# Patient Record
Sex: Male | Born: 1990 | Race: White | Hispanic: No | Marital: Single | Smoking: Current some day smoker
Health system: Southern US, Community
[De-identification: ages and names within clinical notes are randomized; demographics above are authoritative.]

## PROBLEM LIST (undated history)

## (undated) DIAGNOSIS — F319 Bipolar disorder, unspecified: Secondary | ICD-10-CM

## (undated) HISTORY — PX: WRIST SURGERY: SHX841

## (undated) HISTORY — PX: OTHER SURGICAL HISTORY: SHX169

---
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
## 2011-04-26 ENCOUNTER — Ambulatory Visit (INDEPENDENT_AMBULATORY_CARE_PROVIDER_SITE_OTHER): Payer: BC Managed Care – PPO | Admitting: Internal Medicine

## 2011-04-26 VITALS — BP 134/90 | HR 62 | Temp 98.5°F | Resp 16 | Ht 69.25 in | Wt 160.0 lb

## 2011-04-26 DIAGNOSIS — L309 Dermatitis, unspecified: Secondary | ICD-10-CM

## 2011-04-26 DIAGNOSIS — Z1329 Encounter for screening for other suspected endocrine disorder: Secondary | ICD-10-CM

## 2011-04-26 DIAGNOSIS — L259 Unspecified contact dermatitis, unspecified cause: Secondary | ICD-10-CM

## 2011-04-26 DIAGNOSIS — J309 Allergic rhinitis, unspecified: Secondary | ICD-10-CM

## 2011-04-26 MED ORDER — FLUTICASONE PROPIONATE 50 MCG/ACT NA SUSP: 2.0000 | Freq: Every day | NASAL | Status: DC

## 2011-04-26 MED ORDER — PREDNISONE 20 MG PO TABS: ORAL_TABLET | ORAL | Status: DC

## 2011-04-26 NOTE — Progress Notes (Signed)
  Subjective:    Patient ID: Anthony Frazier, male    DOB: May 05, 1990, 21 y.o.   MRN: [id number]  Rash This is a new problem. The current episode started 1 to 4 weeks ago. The problem is unchanged. The rash is diffuse. The rash is characterized by dryness and itchiness. It is unknown if there was an exposure to a precipitant. Associated symptoms include congestion and rhinorrhea. Pertinent negatives include no shortness of breath or sore throat. His past medical history is significant for allergies and eczema.  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  He has been doing "Jordan" recreationally which he describes as a unknown mixture of illicit substances and his rash started 4 weeks ago after using this med.  He has a history of allergies and eczema.  He has not tried to use any meds topically for his rash.    Review of Systems  HENT: Positive for congestion and rhinorrhea. Negative for sore throat.   Respiratory: Negative for shortness of breath.   Cardiovascular: Negative.   Gastrointestinal: Negative.   Genitourinary: Negative.   Musculoskeletal: Negative.   Skin: Positive for rash.  Neurological: Negative.   Hematological: Negative.   Psychiatric/Behavioral: Negative.   All other systems reviewed and are negative.       Objective:   Physical Exam  Vitals reviewed. Constitutional: He appears well-developed and well-nourished. No distress.  HENT:  Head: Normocephalic.  Cardiovascular: Normal rate, regular rhythm and normal heart sounds.   Pulmonary/Chest: Effort normal and breath sounds normal. No respiratory distress. He has no wheezes. He has no rales.  Skin: He is not diaphoretic.       Dry skin with red papular rash on his elbows, wrists, and behind his knees and on his lower legs.  Psychiatric:       Lucid in his thoughts but bordering on manic in his gestures  and speed of speech.            Assessment & Plan:  Eczema and allergic rhinitis.  Short course of prednisone taper given.  Use Zyrtec 10 mg daily and Flonase for his nasal symptoms.  Encouraged that illicit drugs will diminish his future and very possibly cause him health problems.

## 2011-04-26 NOTE — Patient Instructions (Signed)
Take your prednisone taper as prescribed and avoid the allergen!  It is not good for you!  I have sent you a nasal spray to help with the clogged and runny nose symptoms of allergies.  Eczema Atopic dermatitis, or eczema, is an inherited type of sensitive skin. Often people with eczema have a family history of allergies, asthma, or hay fever. It causes a red itchy rash and dry scaly skin. The itchiness may occur before the skin rash and may be very intense. It is not contagious. Eczema is generally worse during the cooler winter months and often improves with the warmth of summer. Eczema usually starts showing signs in infancy. Some children outgrow eczema, but it may last through adulthood. Flare-ups may be caused by:  Eating something or contact with something you are sensitive or allergic to.   Stress.  DIAGNOSIS  The diagnosis of eczema is usually based upon symptoms and medical history. TREATMENT  Eczema cannot be cured, but symptoms usually can be controlled with treatment or avoidance of allergens (things to which you are sensitive or allergic to).  Controlling the itching and scratching.   Use over-the-counter antihistamines as directed for itching. It is especially useful at night when the itching tends to be worse.   Use over-the-counter steroid creams as directed for itching.   Scratching makes the rash and itching worse and may cause impetigo (a skin infection) if fingernails are contaminated (dirty).   Keeping the skin well moisturized with creams every day. This will seal in moisture and help prevent dryness. Lotions containing alcohol and water can dry the skin and are not recommended.   Limiting exposure to allergens.   Recognizing situations that cause stress.   Developing a plan to manage stress.  HOME CARE INSTRUCTIONS   Take prescription and over-the-counter medicines as directed by your caregiver.   Do not use anything on the skin without checking with your  caregiver.   Keep baths or showers short (5 minutes) in warm (not hot) water. Use mild cleansers for bathing. You may add non-perfumed bath oil to the bath water. It is best to avoid soap and bubble bath.   Immediately after a bath or shower, when the skin is still damp, apply a moisturizing ointment to the entire body. This ointment should be a petroleum ointment. This will seal in moisture and help prevent dryness. The thicker the ointment the better. These should be unscented.   Keep fingernails cut short and wash hands often. If your child has eczema, it may be necessary to put soft gloves or mittens on your child at night.   Dress in clothes made of cotton or cotton blends. Dress lightly, as heat increases itching.   Avoid foods that may cause flare-ups. Common foods include cow's milk, peanut butter, eggs and wheat.   Keep a child with eczema away from anyone with fever blisters. The virus that causes fever blisters (herpes simplex) can cause a serious skin infection in children with eczema.  SEEK MEDICAL CARE IF:   Itching interferes with sleep.   The rash gets worse or is not better within one week following treatment.   The rash looks infected (pus or soft yellow scabs).   You or your child has an oral temperature above 102 F (38.9 C).   Your baby is older than 3 months with a rectal temperature of 100.5 F (38.1 C) or higher for more than 1 day.   The rash flares up after contact with someone  who has fever blisters.  SEEK IMMEDIATE MEDICAL CARE IF:   Your baby is older than 3 months with a rectal temperature of 102 F (38.9 C) or higher.   Your baby is older than 3 months or younger with a rectal temperature of 100.4 F (38 C) or higher.  Document Released: 01/21/2000 Document Revised: 01/12/2011 Document Reviewed: 11/25/2008 [facility] Patient Information 2012 [place], [place].

## 2011-07-05 ENCOUNTER — Other Ambulatory Visit: Payer: Self-pay | Admitting: Internal Medicine

## 2011-07-05 ENCOUNTER — Ambulatory Visit (INDEPENDENT_AMBULATORY_CARE_PROVIDER_SITE_OTHER): Payer: BC Managed Care – PPO | Admitting: Family Medicine

## 2011-07-05 ENCOUNTER — Encounter: Payer: Self-pay | Admitting: Family Medicine

## 2011-07-05 VITALS — BP 118/72 | HR 69 | Temp 98.3°F | Resp 16 | Ht 69.25 in | Wt 157.8 lb

## 2011-07-05 DIAGNOSIS — B86 Scabies: Secondary | ICD-10-CM

## 2011-07-05 MED ORDER — PREDNISONE 20 MG PO TABS: ORAL_TABLET | ORAL | Status: DC

## 2011-07-05 MED ORDER — IVERMECTIN 3 MG PO TABS: 3.0000 mg | ORAL_TABLET | Freq: Once | ORAL | Status: DC

## 2011-07-05 NOTE — Progress Notes (Signed)
This is a 21 year old young man who works at the [employer] and has a 5 month history of itching on his forearms, abdomen, and groin. There is quite a bit of itching between the fingers.  Objective multiple papules in streaks on forearms and between fingers  Other papules on abdomen with multiple excoriation both abdomen and forearms.  Assessment: Scabies  Plan: Prednisone and ivermectin.

## 2011-07-05 NOTE — Patient Instructions (Signed)
Scabies Scabies are small bugs (mites) that burrow under the skin and cause red bumps and severe itching. These bugs can only be seen with a microscope. Scabies are highly contagious. They can spread easily from person to person by direct contact. They are also spread through sharing clothing or linens that have the scabies mites living in them. It is not unusual for an entire family to become infected through shared towels, clothing, or bedding.  HOME CARE INSTRUCTIONS   Your caregiver may prescribe a cream or lotion to kill the mites. If this cream is prescribed; massage the cream into the entire area of the body from the neck to the bottom of both feet. Also massage the cream into the scalp and face if your child is less than 1 year old. Avoid the eyes and mouth.   Leave the cream on for 8 to12 hours. Do not wash your hands after application. Your child should bathe or shower after the 8 to 12 hour application period. Sometimes it is helpful to apply the cream to your child at right before bedtime.   One treatment is usually effective and will eliminate approximately 95% of infestations. For severe cases, your caregiver may decide to repeat the treatment in 1 week. Everyone in your household should be treated with one application of the cream.   New rashes or burrows should not appear after successful treatment within 24 to 48 hours; however the itching and rash may last for 2 to 4 weeks after successful treatment. If your symptoms persist longer than this, see your caregiver.   Your caregiver also may prescribe a medication to help with the itching or to help the rash go away more quickly.   Scabies can live on clothing or linens for up to 3 days. Your entire child's recently used clothing, towels, stuffed toys, and bed linens should be washed in hot water and then dried in a dryer for at least 20 minutes on high heat. Items that cannot be washed should be enclosed in a plastic bag for at least 3  days.   To help relieve itching, bathe your child in a cool bath or apply cool washcloths to the affected areas.   Your child may return to school after treatment with the prescribed cream.  SEEK MEDICAL CARE IF:   The itching persists longer than 4 weeks after treatment.   The rash spreads or becomes infected (the area has red blisters or yellow-tan crust).  Document Released: 01/23/2005 Document Revised: 01/12/2011 Document Reviewed: 06/03/2008 [employer] Patient Information 2012 [employer]. 

## 2011-07-18 ENCOUNTER — Ambulatory Visit (INDEPENDENT_AMBULATORY_CARE_PROVIDER_SITE_OTHER): Payer: BC Managed Care – PPO | Admitting: Family Medicine

## 2011-07-18 VITALS — BP 118/74 | HR 60 | Temp 97.8°F | Resp 20 | Ht 69.25 in | Wt 157.0 lb

## 2011-07-18 DIAGNOSIS — B86 Scabies: Secondary | ICD-10-CM

## 2011-07-18 MED ORDER — PREDNISONE 20 MG PO TABS: ORAL_TABLET | ORAL | Status: DC

## 2011-07-18 MED ORDER — IVERMECTIN 3 MG PO TABS: 3.0000 mg | ORAL_TABLET | Freq: Once | ORAL | Status: DC

## 2011-07-18 NOTE — Progress Notes (Signed)
21 yo working at [employer] with episode of scabies.  Rash is coming back  O:  Faint papular forearm and abdominal rash  A:  Recurrent scabies  P:  Refill pred and stromectol 1. Scabies  ivermectin (STROMECTOL) 3 MG TABS, predniSONE (DELTASONE) 20 MG tablet

## 2011-12-26 ENCOUNTER — Other Ambulatory Visit: Payer: Self-pay | Admitting: Family Medicine

## 2012-02-26 ENCOUNTER — Other Ambulatory Visit: Payer: Self-pay | Admitting: Family Medicine
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
## 2012-03-27 IMAGING — CR DG SHOULDER 2+V*R*
2 series · 2 of 2 positions shown · non-contrast
Comparison: Right shoulder earlier this same date.

CLINICAL DATA: Reduction of dislocation.

RIGHT SHOULDER - 2+ VIEW

[w shoulder ap internal righ]
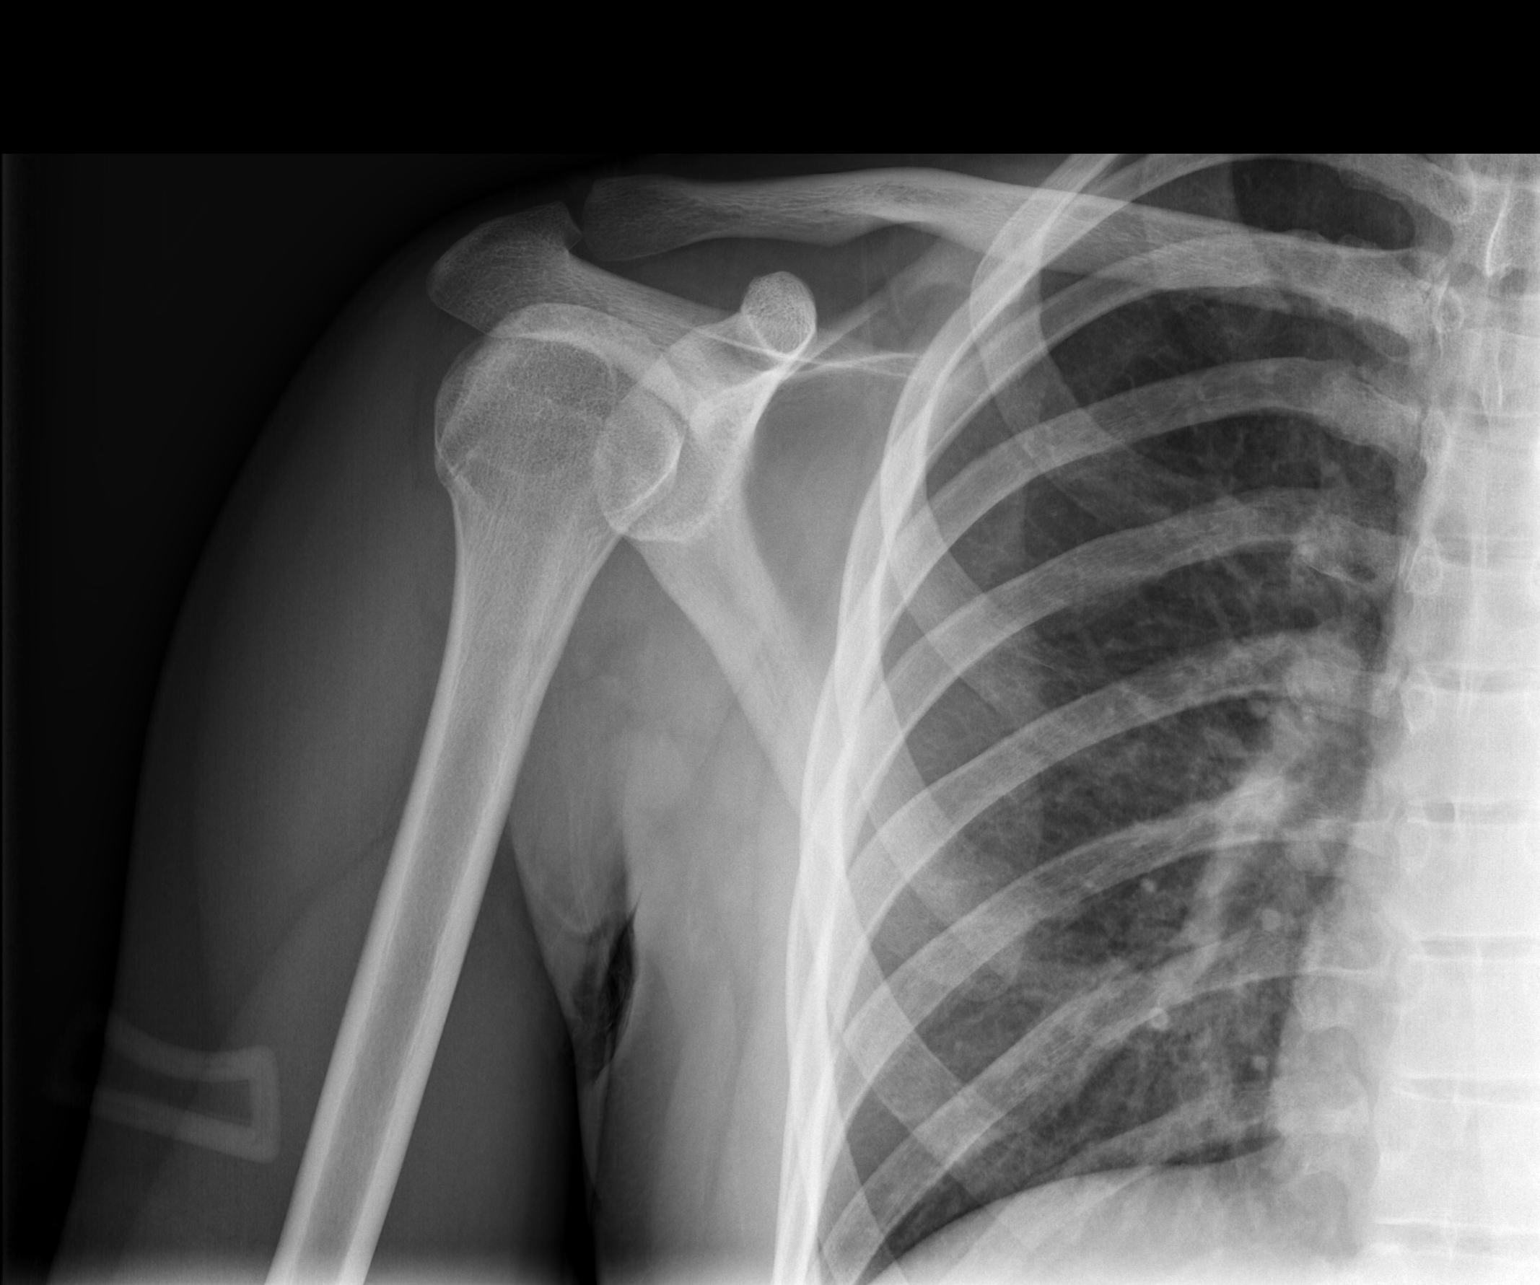

[w shoulder y view right]
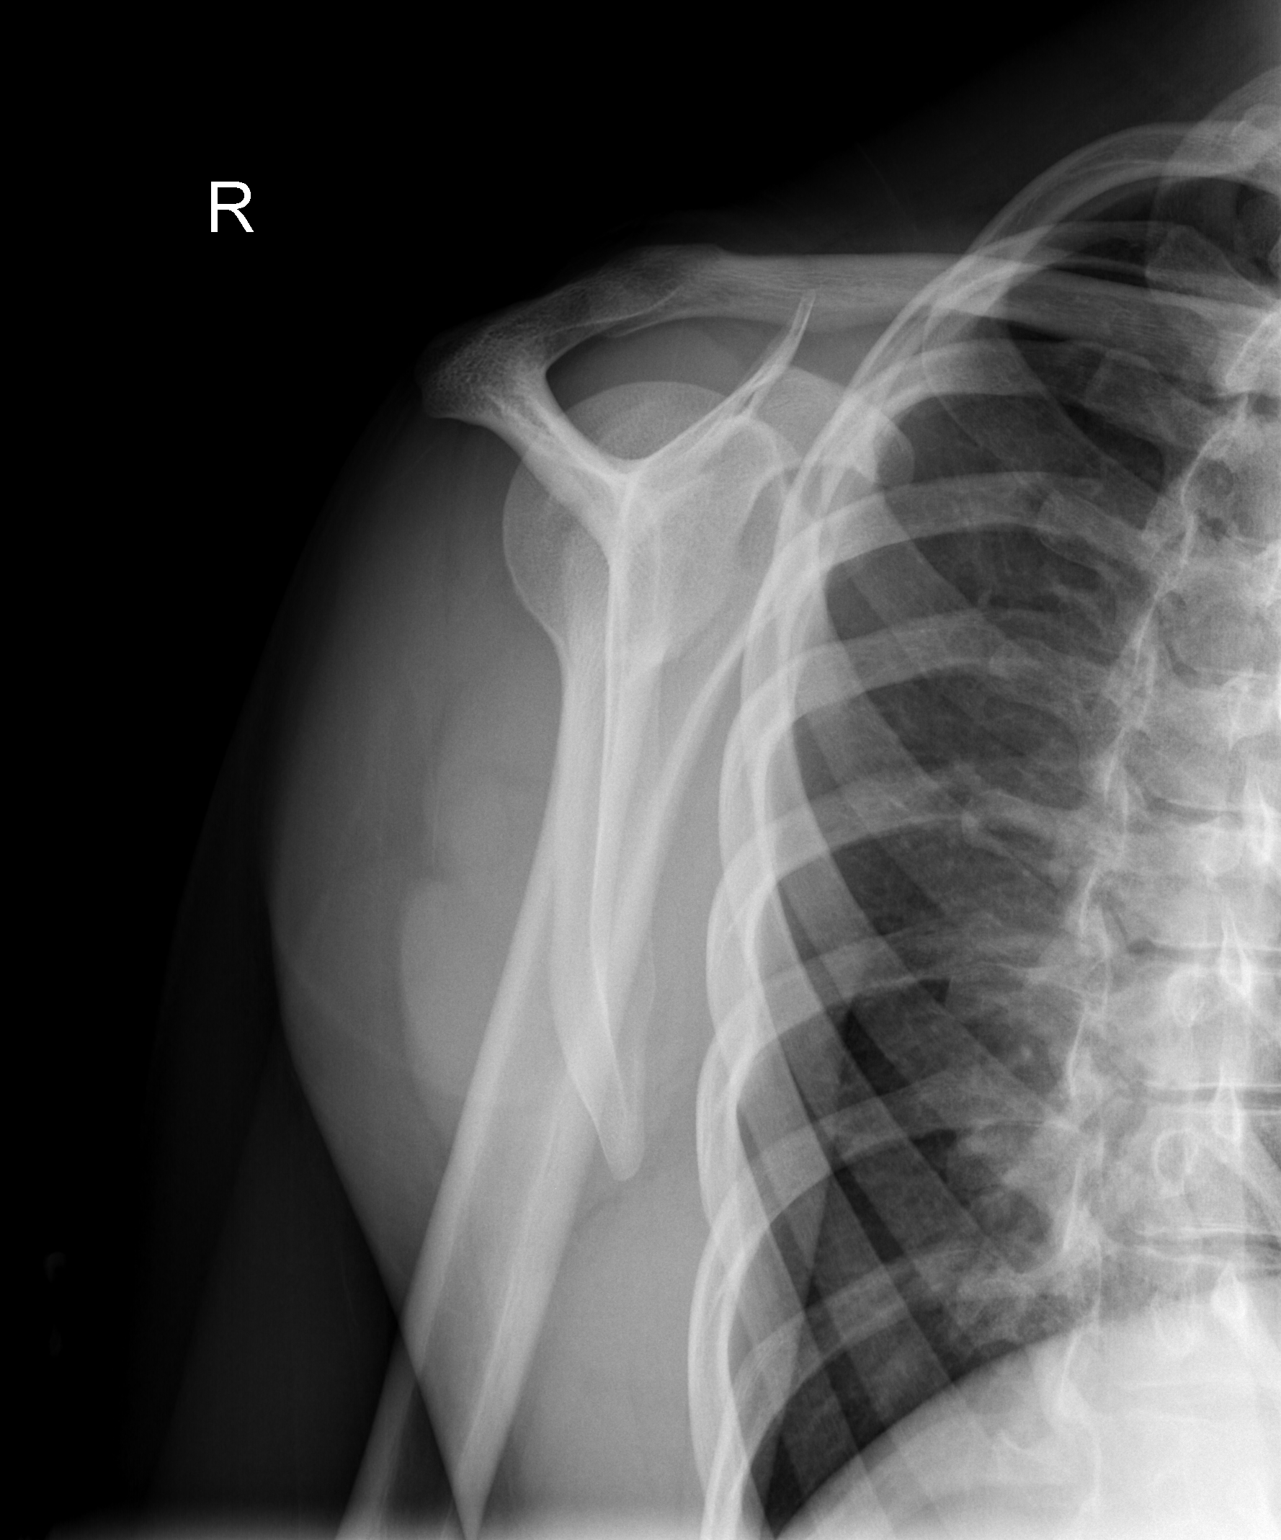

[2 of 2 positions shown; findings below may reference images not displayed]

FINDINGS: Right humerus is been reduced.  [place]-Sachs
deformity is identified.
IMPRESSION: As above.

## 2013-09-08 ENCOUNTER — Ambulatory Visit (INDEPENDENT_AMBULATORY_CARE_PROVIDER_SITE_OTHER): Payer: BC Managed Care – PPO | Admitting: Internal Medicine

## 2013-09-08 VITALS — BP 122/72 | HR 65 | Temp 97.6°F | Resp 16 | Ht 69.5 in | Wt 179.6 lb

## 2013-09-08 DIAGNOSIS — F39 Unspecified mood [affective] disorder: Secondary | ICD-10-CM

## 2013-09-08 DIAGNOSIS — Z7189 Other specified counseling: Secondary | ICD-10-CM

## 2013-09-08 LAB — CBC WITH DIFFERENTIAL/PLATELET
BASOS PCT: 1 % (ref 0–1)
Basophils Absolute: 0.1 10*3/uL (ref 0.0–0.1)
EOS ABS: 0.2 10*3/uL (ref 0.0–0.7)
EOS PCT: 3 % (ref 0–5)
HCT: 41 % (ref 39.0–52.0)
Hemoglobin: 14.1 g/dL (ref 13.0–17.0)
LYMPHS ABS: 2.5 10*3/uL (ref 0.7–4.0)
Lymphocytes Relative: 35 % (ref 12–46)
MCH: 30.3 pg (ref 26.0–34.0)
MCHC: 34.4 g/dL (ref 30.0–36.0)
MCV: 88.2 fL (ref 78.0–100.0)
Monocytes Absolute: 0.4 10*3/uL (ref 0.1–1.0)
Monocytes Relative: 6 % (ref 3–12)
NEUTROS PCT: 55 % (ref 43–77)
Neutro Abs: 3.9 10*3/uL (ref 1.7–7.7)
PLATELETS: 292 10*3/uL (ref 150–400)
RBC: 4.65 MIL/uL (ref 4.22–5.81)
RDW: 13.2 % (ref 11.5–15.5)
WBC: 7.1 10*3/uL (ref 4.0–10.5)

## 2013-09-08 LAB — COMPREHENSIVE METABOLIC PANEL
ALBUMIN: 5 g/dL (ref 3.5–5.2)
ALT: 22 U/L (ref 0–53)
AST: 29 U/L (ref 0–37)
Alkaline Phosphatase: 70 U/L (ref 39–117)
BUN: 11 mg/dL (ref 6–23)
CALCIUM: 9.7 mg/dL (ref 8.4–10.5)
CO2: 25 meq/L (ref 19–32)
Chloride: 103 mEq/L (ref 96–112)
Creat: 1.11 mg/dL (ref 0.50–1.35)
Glucose, Bld: 79 mg/dL (ref 70–99)
POTASSIUM: 3.9 meq/L (ref 3.5–5.3)
SODIUM: 141 meq/L (ref 135–145)
TOTAL PROTEIN: 7.3 g/dL (ref 6.0–8.3)
Total Bilirubin: 0.3 mg/dL (ref 0.2–1.2)

## 2013-09-08 LAB — LIPID PANEL
Cholesterol: 158 mg/dL (ref 0–200)
HDL: 56 mg/dL (ref >39)
LDL CALC: 58 mg/dL (ref 0–99)
TRIGLYCERIDES: 219 mg/dL — AB (ref <150)
Total CHOL/HDL Ratio: 2.8 Ratio
VLDL: 44 mg/dL — AB (ref 0–40)

## 2013-09-08 LAB — TSH: TSH: 5.885 u[IU]/mL — AB (ref 0.350–4.500)

## 2013-09-08 MED ORDER — LITHIUM CARBONATE 300 MG PO TABS: ORAL_TABLET | ORAL | Status: AC

## 2013-09-08 NOTE — Progress Notes (Signed)
   Subjective:    Patient ID: Anthony Frazier, male    DOB: 03/14/1990, 23 y.o.   MRN: [id number]  HPI 23 year old male here to discuss medication. He is currently taking Lithium and request refills. Recently moved back to [place] from [place] and was not taking it on a regular basis while in [place].  He feels good and no side affects with the med. General health is good. Psychiatrist visit in one month  Review of Systems     Objective:   Physical Exam  Constitutional: He is oriented to person, place, and time. He appears well-developed and well-nourished. No distress.  HENT:  Head: Normocephalic.  Mouth/Throat: Oropharynx is clear and moist.  Eyes: EOM are normal. Pupils are equal, round, and reactive to light. No scleral icterus.  Neck: Normal range of motion. Neck supple. No thyromegaly present.  Cardiovascular: Normal rate, regular rhythm and normal heart sounds.   Pulmonary/Chest: Effort normal and breath sounds normal.  Abdominal: Soft. There is no tenderness.  Lymphadenopathy:    He has no cervical adenopathy.  Neurological: He is alert and oriented to person, place, and time. No cranial nerve deficit. He exhibits normal muscle tone. Coordination normal.  Psychiatric: He has a normal mood and affect. His behavior is normal. Judgment and thought content normal.          Assessment & Plan:  Medicine review/Lithium level Health screen RF lithium 300mg  tid/see psychiatrist soon

## 2013-09-08 NOTE — Patient Instructions (Signed)
Lithium extended-release tablets What is this medicine? LITHIUM (LITH ee um) is used to prevent and treat the manic episodes caused by manic-depressive illness. This medicine may be used for other purposes; ask your health care provider or pharmacist if you have questions. COMMON BRAND NAME(S): Eskalith CR, Lithobid What should I tell my health care provider before I take this medicine? They need to know if you have any of these conditions: -Brugada Syndrome -dehydration (diarrhea or sweating) -heart or blood vessel disease -kidney disease -low level of salt in the blood, or low salt diet -an unusual or allergic reaction to lithium, other medicines, foods, dyes, or preservatives -pregnant or trying to get pregnant -breast-feeding How should I use this medicine? Take this medicine by mouth with a glass of water. Follow the directions on the prescription label. Swallow the tablets whole. Do not break, crush or chew. Take after a meal or snack to avoid stomach upset. Take your doses at regular intervals. Do not take your medicine more often than directed. The amount of this medicine you take is very important. Taking more than the prescribed dose can cause serious side effects. Do not stop taking except on your the advice of your doctor or health care professional. Talk to your pediatrician regarding the use of this medicine in children. Special care may be needed. While this drug may be prescribed for children as young as 12 years for selected conditions, precautions do apply. Overdosage: If you think you have taken too much of this medicine contact a poison control center or emergency room at once. NOTE: This medicine is only for you. Do not share this medicine with others. What if I miss a dose? If you miss a dose, take it as soon as you can. If it is almost time for your next dose, take only that dose. Do not take double or extra doses. What may interact with this medicine? Do not take this  medicine with any of the following medications: -stimulant medicines used to treat ADHD or narcolepsy This medicine may also interact with the following medications: -caffeine -calcium iodide -carbamazepine -diuretics -medicines for high blood pressure -medicines for mental problems and psychotic disturbances -metronidazole -NSAIDs, medicines for pain and inflammation, like ibuprofen or naproxen -phenytoin -potassium iodide, KI -sodium bicarbonate -sodium chloride -urea This list may not describe all possible interactions. Give your health care provider a list of all the medicines, herbs, non-prescription drugs, or dietary supplements you use. Also tell them if you smoke, drink alcohol, or use illegal drugs. Some items may interact with your medicine. What should I watch for while using this medicine? Visit your doctor or health care professional for regular checks on your progress. It can take several weeks of treatment before you start to get better. The amount of salt (sodium) in your body influences the effects of this medicine, and this medicine can increase salt loss from the body. Eat a normal diet that includes salt. Do not change to salt substitutes. Avoid changes involving diet, or medications that include large amounts of sodium like sodium bicarbonate. Ask your doctor or health care professional for advice if you are not sure. Drink plenty of fluids while you are taking this medicine. Avoid drinks that contain caffeine, such as coffee, tea and colas. You will need extra fluids if you have diarrhea or sweat a lot. This will help prevent toxic effects from this medicine. Be careful not to get overheated during exercise, saunas, hot baths, and hot weather. Consult your  doctor or health care professional if you have a high fever or persistent diarrhea. You may get drowsy or dizzy. Do not drive, use machinery, or do anything that needs mental alertness until you know how this medicine  affects you. Do not stand or sit up quickly, especially if you are an older patient. This reduces the risk of dizzy or fainting spells. What side effects may I notice from receiving this medicine? Side effects that you should report to your doctor or health care professional as soon as possible: -allergic reactions like skin rash, itching or hives, swelling of the face, lips, or tongue -blurred vision -breathing problems -clumsiness or loss of balance -confusion -difficulty speaking or swallowing -dizziness -feeling faint or lightheaded, falls -increased thirst -increased urination -loss of appetite -muscle weakness -nausea, vomiting -pain, coldness, or blue coloration of fingers or toes -sensitivity to cold -seizures -slow, fast, or irregular heartbeat (palpitations) -slurred speech -swelling in the neck -unusually weak or tired Side effects that usually do not require medical attention (report to your doctor or health care professional if they continue or are bothersome): -acne -diarrhea -mild tremor -stomach pain -weight gain This list may not describe all possible side effects. Call your doctor for medical advice about side effects. You may report side effects to FDA at 1-800-FDA-1088. Where should I keep my medicine? Keep out of the reach of children. Store at room temperature between 15 and 30 degrees C (59 and 86 degrees F). Keep container tightly closed. Protect from light. Throw away any unused medicine after the expiration date. NOTE: This sheet is a summary. It may not cover all possible information. If you have questions about this medicine, talk to your doctor, pharmacist, or health care provider.  2015, [employer]vier/Gold Standard. (2010-02-25 15:18:53) Smoking Cessation Quitting smoking is important to your health and has many advantages. However, it is not always easy to quit since nicotine is a very addictive drug. Oftentimes, people try 3 times or more before being  able to quit. This document explains the best ways for you to prepare to quit smoking. Quitting takes hard work and a lot of effort, but you can do it. ADVANTAGES OF QUITTING SMOKING  You will live longer, feel better, and live better.  Your body will feel the impact of quitting smoking almost immediately.  Within 20 minutes, blood pressure decreases. Your pulse returns to its normal level.  After 8 hours, carbon monoxide levels in the blood return to normal. Your oxygen level increases.  After 24 hours, the chance of having a heart attack starts to decrease. Your breath, hair, and body stop smelling like smoke.  After 48 hours, damaged nerve endings begin to recover. Your sense of taste and smell improve.  After 72 hours, the body is virtually free of nicotine. Your bronchial tubes relax and breathing becomes easier.  After 2 to 12 weeks, lungs can hold more air. Exercise becomes easier and circulation improves.  The risk of having a heart attack, stroke, cancer, or lung disease is greatly reduced.  After 1 year, the risk of coronary heart disease is cut in half.  After 5 years, the risk of stroke falls to the same as a nonsmoker.  After 10 years, the risk of lung cancer is cut in half and the risk of other cancers decreases significantly.  After 15 years, the risk of coronary heart disease drops, usually to the level of a nonsmoker.  If you are pregnant, quitting smoking will improve your chances of  having a healthy baby.  The people you live with, especially any children, will be healthier.  You will have extra money to spend on things other than cigarettes. QUESTIONS TO THINK ABOUT BEFORE ATTEMPTING TO QUIT You may want to talk about your answers with your health care provider.  Why do you want to quit?  If you tried to quit in the past, what helped and what did not?  What will be the most difficult situations for you after you quit? How will you plan to handle  them?  Who can help you through the tough times? Your family? Friends? A health care provider?  What pleasures do you get from smoking? What ways can you still get pleasure if you quit? Here are some questions to ask your health care provider:  How can you help me to be successful at quitting?  What medicine do you think would be best for me and how should I take it?  What should I do if I need more help?  What is smoking withdrawal like? How can I get information on withdrawal? GET READY  Set a quit date.  Change your environment by getting rid of all cigarettes, ashtrays, matches, and lighters in your home, car, or work. Do not let people smoke in your home.  Review your past attempts to quit. Think about what worked and what did not. GET SUPPORT AND ENCOURAGEMENT You have a better chance of being successful if you have help. You can get support in many ways.  Tell your family, friends, and coworkers that you are going to quit and need their support. Ask them not to smoke around you.  Get individual, group, or telephone counseling and support. Programs are available at [employer] and health centers. Call your local health department for information about programs in your area.  Spiritual beliefs and practices may help some smokers quit.  Download a "quit meter" on your computer to keep track of quit statistics, such as how long you have gone without smoking, cigarettes not smoked, and money saved.  Get a self-help book about quitting smoking and staying off tobacco. LEARN NEW SKILLS AND BEHAVIORS  Distract yourself from urges to smoke. Talk to someone, go for a walk, or occupy your time with a task.  Change your normal routine. Take a different route to work. Drink tea instead of coffee. Eat breakfast in a different place.  Reduce your stress. Take a hot bath, exercise, or read a book.  Plan something enjoyable to do every day. Reward yourself for not  smoking.  Explore interactive web-based programs that specialize in helping you quit. GET MEDICINE AND USE IT CORRECTLY Medicines can help you stop smoking and decrease the urge to smoke. Combining medicine with the above behavioral methods and support can greatly increase your chances of successfully quitting smoking.  Nicotine replacement therapy helps deliver nicotine to your body without the negative effects and risks of smoking. Nicotine replacement therapy includes nicotine gum, lozenges, inhalers, nasal sprays, and skin patches. Some may be available over-the-counter and others require a prescription.  Antidepressant medicine helps people abstain from smoking, but how this works is unknown. This medicine is available by prescription.  Nicotinic receptor partial agonist medicine simulates the effect of nicotine in your brain. This medicine is available by prescription. Ask your health care provider for advice about which medicines to use and how to use them based on your health history. Your health care provider will tell you what side  effects to look out for if you choose to be on a medicine or therapy. Carefully read the information on the package. Do not use any other product containing nicotine while using a nicotine replacement product.  RELAPSE OR DIFFICULT SITUATIONS Most relapses occur within the first 3 months after quitting. Do not be discouraged if you start smoking again. Remember, most people try several times before finally quitting. You may have symptoms of withdrawal because your body is used to nicotine. You may crave cigarettes, be irritable, feel very hungry, cough often, get headaches, or have difficulty concentrating. The withdrawal symptoms are only temporary. They are strongest when you first quit, but they will go away within 10-14 days. To reduce the chances of relapse, try to:  Avoid drinking alcohol. Drinking lowers your chances of successfully quitting.  Reduce the  amount of caffeine you consume. Once you quit smoking, the amount of caffeine in your body increases and can give you symptoms, such as a rapid heartbeat, sweating, and anxiety.  Avoid smokers because they can make you want to smoke.  Do not let weight gain distract you. Many smokers will gain weight when they quit, usually less than 10 pounds. Eat a healthy diet and stay active. You can always lose the weight gained after you quit.  Find ways to improve your mood other than smoking. FOR MORE INFORMATION  www.smokefree.gov  Document Released: 01/17/2001 Document Revised: 06/09/2013 Document Reviewed: 05/04/2011 [facility] Patient Information 2015 [place], [place]. This information is not intended to replace advice given to you by your health care provider. Make sure you discuss any questions you have with your health care provider.

## 2013-09-09 LAB — LITHIUM LEVEL: Lithium Lvl: 0.5 mEq/L — ABNORMAL LOW (ref 0.80–1.40)

## 2013-09-12 ENCOUNTER — Encounter: Payer: Self-pay | Admitting: *Deleted

## 2013-10-06 ENCOUNTER — Ambulatory Visit (INDEPENDENT_AMBULATORY_CARE_PROVIDER_SITE_OTHER): Payer: BC Managed Care – PPO | Admitting: Internal Medicine

## 2013-10-06 VITALS — BP 116/78 | HR 72 | Temp 97.5°F | Resp 16 | Ht 70.0 in | Wt 178.2 lb

## 2013-10-06 DIAGNOSIS — R946 Abnormal results of thyroid function studies: Secondary | ICD-10-CM

## 2013-10-06 DIAGNOSIS — L237 Allergic contact dermatitis due to plants, except food: Secondary | ICD-10-CM

## 2013-10-06 DIAGNOSIS — Z79899 Other long term (current) drug therapy: Secondary | ICD-10-CM

## 2013-10-06 DIAGNOSIS — L255 Unspecified contact dermatitis due to plants, except food: Secondary | ICD-10-CM

## 2013-10-06 DIAGNOSIS — R7989 Other specified abnormal findings of blood chemistry: Secondary | ICD-10-CM

## 2013-10-06 LAB — TSH: TSH: 3.229 u[IU]/mL (ref 0.350–4.500)

## 2013-10-06 LAB — T4, FREE: Free T4: 1.25 ng/dL (ref 0.80–1.80)

## 2013-10-06 LAB — T3, FREE: T3 FREE: 3.5 pg/mL (ref 2.3–4.2)

## 2013-10-06 MED ORDER — PREDNISONE 20 MG PO TABS: ORAL_TABLET | ORAL | Status: AC

## 2013-10-06 MED ORDER — METHYLPREDNISOLONE ACETATE 80 MG/ML IJ SUSP
120.0000 mg | Freq: Once | INTRAMUSCULAR | Status: AC
Start: 2013-10-06 — End: 2013-10-06
  Administered 2013-10-06: 120 mg via INTRAMUSCULAR

## 2013-10-06 NOTE — Patient Instructions (Addendum)
Poison [employer] ivy is a inflammation of the skin (contact dermatitis) caused by touching the allergens on the leaves of the ivy plant following previous exposure to the plant. The rash usually appears 48 hours after exposure. The rash is usually bumps (papules) or blisters (vesicles) in a linear pattern. Depending on your own sensitivity, the rash may simply cause redness and itching, or it may also progress to blisters which may break open. These must be well cared for to prevent secondary bacterial (germ) infection, followed by scarring. Keep any open areas dry, clean, dressed, and covered with an antibacterial ointment if needed. The eyes may also get puffy. The puffiness is worst in the morning and gets better as the day progresses. This dermatitis usually heals without scarring, within 2 to 3 weeks without treatment. HOME CARE INSTRUCTIONS  Thoroughly wash with soap and water as soon as you have been exposed to poison ivy. You have about one half hour to remove the plant resin before it will cause the rash. This washing will destroy the oil or antigen on the skin that is causing, or will cause, the rash. Be sure to wash under your fingernails as any plant resin there will continue to spread the rash. Do not rub skin vigorously when washing affected area. Poison ivy cannot spread if no oil from the plant remains on your body. A rash that has progressed to weeping sores will not spread the rash unless you have not washed thoroughly. It is also important to wash any clothes you have been wearing as these may carry active allergens. The rash will return if you wear the unwashed clothing, even several days later. Avoidance of the plant in the future is the best measure. Poison ivy plant can be recognized by the number of leaves. Generally, poison ivy has three leaves with flowering branches on a single stem. Diphenhydramine may be purchased over the counter and used as needed for itching. Do not drive with  this medication if it makes you drowsy.Ask your caregiver about medication for children. SEEK MEDICAL CARE IF:  Open sores develop.  Redness spreads beyond area of rash.  You notice purulent (pus-like) discharge.  You have increased pain.  Other signs of infection develop (such as fever). Document Released: 01/21/2000 Document Revised: 04/17/2011 Document Reviewed: 07/03/2008 [facility] Patient Information 2015 [place], [place]. This information is not intended to replace advice given to you by your health care provider. Make sure you discuss any questions you have with your health care provider. Lithium Toxicity Lithium is a medicine used to treat depression and bipolar disorders. Lithium poisoning (toxicity) occurs when the amount of lithium in the blood is too high. The greater the amount in the blood, the more problems occur. This is common among those taking lithium. This is because the amount of lithium needed to help the disease is not much different than the amount that can cause poisoning. Hence, it is very important to detect signs of toxicity early.  CAUSES  Lithium toxicity can occur for several reasons:  Intentional overdose.  Accidental overdose.  If taking many medicines, confusion can lead to taking too much or not enough of one or more prescriptions.  Even if taking lithium correctly, other medicines can change levels in the blood. Many drugs (such as medicines used for arthritis, some antibiotics, water pills (diuretics), dilantin (used for control of epilepsy), and cyclosporine (used for patients receiving transplants) can increase lithium levels.  If unrelated diseases develop, the amount of lithium  in the blood might go up or down.  While working with your caregiver to fine tune your dosing, it is possible that a prescribed dose might be too much. SYMPTOMS  The symptoms of mild-to-moderate lithium toxicity are:    Diarrhea.  Vomiting.  Drowsiness.  Thirst.  Muscle weakness.  Mild shakiness, particularly of the hands.  Frequent urination. The symptoms of severe toxicity are:  Blurred vision.  Giddiness.  Ringing in the ears (Tinnitus).  Seizures.  Increased shakiness.  Severe muscle spasms.  Loss of consciousness or coma. Some people will get symptoms of mild toxicity even if the level gets close to the top of the normal range. Even if your lab test shows a blood level that is "normal", it is important to tell your caregiver if you are having any of the symptoms noted above. DIAGNOSIS  Your caregiver will look at your signs and symptoms. You will be asked about the dose of lithium you are taking and when the last dose was taken. You will also be asked about how long you have been on the medicine. Then, your caregiver will order blood tests to check your blood lithium level.  TREATMENT  Treatment is mainly supportive.   Mild symptoms can be controlled by reducing or stopping the drug. This can be restarted 24-48 hours later at a lower dose. Your caregiver may recommend changing the daily dose of lithium.  Severe cases are treated by removing lithium from the body. Care is started in a hospital emergency department.  A tube may be placed through the nose or mouth into the stomach. The tube can be used to remove lithium that has not been digested yet. It can also be used to put medicines directly into the stomach to help stop lithium absorption.  Medicines may be used that cause diarrhea. This can help stop absorption of lithium. Your caregiver may suggest cleaning of blood using an artificial kidney (dialysis). This is usually only used for the most severe cases. HOME CARE INSTRUCTIONS   Take the dose of lithium as advised by your caregiver.  Never take more than your advised dose.  Drink plenty of water as dehydration increases the risk of lithium toxicity.  Seek the advice  of your caregiver before starting a low-salt diet as reducing your salt intake can lead to toxicity.  Check your lithium levels in the blood regularly. Tests are generally done 12-18 hours after the last dose. SEEK MEDICAL CARE IF:   You notice any symptoms of lithium toxicity as described above.  You are confused or have questions about how to take your medicines. SEEK IMMEDIATE MEDICAL CARE IF:   Your symptoms are severe.  You discover that you have accidentally taken too much lithium or too much of any other medicine. MAKE SURE YOU:   Understand these instructions.  Will watch your condition.  Will get help right away if you are not doing well or get worse. Document Released: 11/20/2006 Document Revised: 04/17/2011 Document Reviewed: 11/20/2006 [facility] Patient Information 2015 [place], [place]. This information is not intended to replace advice given to you by your health care provider. Make sure you discuss any questions you have with your health care provider.

## 2013-10-06 NOTE — Progress Notes (Signed)
   Subjective:    Patient ID: Anthony Frazier, male    DOB: Jul 07, 1990, 23 y.o.   MRN: [id number]  HPI 23 year old male here for poison ivy. He has had it for one week. It started on his right leg then moved to left knee, right forearm, left forearm, and waistline on right side. Has tried calamine lotion and other hydrocortisone cream with no relief. No fever or chills.   TSH was a little high, probable Lithium affect, will reck that. He did have a minor manic episode he worked thru this week.   Review of Systems     Objective:   Physical Exam  Constitutional: He is oriented to person, place, and time. He appears well-developed and well-nourished. No distress.  HENT:  Head: Normocephalic.  Eyes: EOM are normal. Pupils are equal, round, and reactive to light.  Neck: Normal range of motion. Neck supple.  Cardiovascular: Normal rate.   Pulmonary/Chest: Effort normal.  Neurological: He is alert and oriented to person, place, and time. He exhibits normal muscle tone. Coordination normal.  Skin: Rash noted. There is erythema.  Psychiatric: He has a normal mood and affect. His behavior is normal. Judgment and thought content normal.    Classic linear plaques, blebs, papules of PI      Assessment & Plan:  Poison Ivy Depomedrol  IM Prednisone taper 60-40-20-10  12day Calamine/prevent infection/Benedryl and zyrtec

## 2013-10-07 ENCOUNTER — Encounter: Payer: Self-pay | Admitting: Radiology

## 2015-07-18 ENCOUNTER — Emergency Department (HOSPITAL_COMMUNITY): Payer: BLUE CROSS/BLUE SHIELD

## 2015-07-18 ENCOUNTER — Encounter (HOSPITAL_COMMUNITY): Payer: Self-pay | Admitting: Emergency Medicine

## 2015-07-18 ENCOUNTER — Emergency Department (HOSPITAL_COMMUNITY)
Admission: EM | Admit: 2015-07-18 | Discharge: 2015-07-19 | Disposition: A | Discharge status: Home / Self Care | Payer: BLUE CROSS/BLUE SHIELD | Attending: Emergency Medicine | Admitting: Emergency Medicine

## 2015-07-18 DIAGNOSIS — Y939 Activity, unspecified: Secondary | ICD-10-CM | POA: Diagnosis not present

## 2015-07-18 DIAGNOSIS — M25552 Pain in left hip: Secondary | ICD-10-CM | POA: Diagnosis not present

## 2015-07-18 DIAGNOSIS — S50812A Abrasion of left forearm, initial encounter: Secondary | ICD-10-CM | POA: Insufficient documentation

## 2015-07-18 DIAGNOSIS — Y999 Unspecified external cause status: Secondary | ICD-10-CM | POA: Diagnosis not present

## 2015-07-18 DIAGNOSIS — Y92410 Unspecified street and highway as the place of occurrence of the external cause: Secondary | ICD-10-CM | POA: Insufficient documentation

## 2015-07-18 DIAGNOSIS — S80212A Abrasion, left knee, initial encounter: Secondary | ICD-10-CM | POA: Diagnosis not present

## 2015-07-18 DIAGNOSIS — F17200 Nicotine dependence, unspecified, uncomplicated: Secondary | ICD-10-CM | POA: Diagnosis not present

## 2015-07-18 DIAGNOSIS — S80211A Abrasion, right knee, initial encounter: Secondary | ICD-10-CM | POA: Diagnosis not present

## 2015-07-18 DIAGNOSIS — T07XXXA Unspecified multiple injuries, initial encounter: Secondary | ICD-10-CM

## 2015-07-18 DIAGNOSIS — S31119A Laceration without foreign body of abdominal wall, unspecified quadrant without penetration into peritoneal cavity, initial encounter: Secondary | ICD-10-CM | POA: Diagnosis not present

## 2015-07-18 DIAGNOSIS — F10920 Alcohol use, unspecified with intoxication, uncomplicated: Secondary | ICD-10-CM

## 2015-07-18 DIAGNOSIS — F10220 Alcohol dependence with intoxication, uncomplicated: Secondary | ICD-10-CM | POA: Diagnosis not present

## 2015-07-18 DIAGNOSIS — S0101XA Laceration without foreign body of scalp, initial encounter: Secondary | ICD-10-CM | POA: Insufficient documentation

## 2015-07-18 DIAGNOSIS — S0990XA Unspecified injury of head, initial encounter: Secondary | ICD-10-CM | POA: Diagnosis present

## 2015-07-18 HISTORY — DX: Bipolar disorder, unspecified: F31.9

## 2015-07-18 LAB — I-STAT CHEM 8, ED
BUN: 17 mg/dL (ref 6–20)
CREATININE: 1.4 mg/dL — AB (ref 0.61–1.24)
Calcium, Ion: 1.03 mmol/L — ABNORMAL LOW (ref 1.12–1.23)
Chloride: 102 mmol/L (ref 101–111)
Glucose, Bld: 96 mg/dL (ref 65–99)
HEMATOCRIT: 44 % (ref 39.0–52.0)
Hemoglobin: 15 g/dL (ref 13.0–17.0)
POTASSIUM: 3.7 mmol/L (ref 3.5–5.1)
Sodium: 138 mmol/L (ref 135–145)
TCO2: 21 mmol/L (ref 0–100)

## 2015-07-18 LAB — URINALYSIS, ROUTINE W REFLEX MICROSCOPIC
BILIRUBIN URINE: NEGATIVE
Glucose, UA: NEGATIVE mg/dL
KETONES UR: NEGATIVE mg/dL
Leukocytes, UA: NEGATIVE
NITRITE: NEGATIVE
PH: 5.5 (ref 5.0–8.0)
PROTEIN: NEGATIVE mg/dL
Specific Gravity, Urine: 1.007 (ref 1.005–1.030)

## 2015-07-18 LAB — COMPREHENSIVE METABOLIC PANEL
ALT: 23 U/L (ref 17–63)
AST: 32 U/L (ref 15–41)
Albumin: 4.4 g/dL (ref 3.5–5.0)
Alkaline Phosphatase: 58 U/L (ref 38–126)
Anion gap: 15 (ref 5–15)
BILIRUBIN TOTAL: 0.6 mg/dL (ref 0.3–1.2)
BUN: 14 mg/dL (ref 6–20)
CALCIUM: 8.8 mg/dL — AB (ref 8.9–10.3)
CHLORIDE: 101 mmol/L (ref 101–111)
CO2: 19 mmol/L — ABNORMAL LOW (ref 22–32)
CREATININE: 0.99 mg/dL (ref 0.61–1.24)
GFR calc Af Amer: >60 mL/min (ref >60)
Glucose, Bld: 97 mg/dL (ref 65–99)
Potassium: 3.5 mmol/L (ref 3.5–5.1)
Sodium: 135 mmol/L (ref 135–145)
TOTAL PROTEIN: 7.1 g/dL (ref 6.5–8.1)

## 2015-07-18 LAB — URINE MICROSCOPIC-ADD ON: WBC, UA: NONE SEEN WBC/hpf (ref 0–5)

## 2015-07-18 LAB — ETHANOL: ALCOHOL ETHYL (B): 293 mg/dL — AB (ref <5)

## 2015-07-18 LAB — CBC
HCT: 41.7 % (ref 39.0–52.0)
Hemoglobin: 13.8 g/dL (ref 13.0–17.0)
MCH: 28.6 pg (ref 26.0–34.0)
MCHC: 33.1 g/dL (ref 30.0–36.0)
MCV: 86.5 fL (ref 78.0–100.0)
PLATELETS: 260 10*3/uL (ref 150–400)
RBC: 4.82 MIL/uL (ref 4.22–5.81)
RDW: 12.7 % (ref 11.5–15.5)
WBC: 9.6 10*3/uL (ref 4.0–10.5)

## 2015-07-18 LAB — I-STAT CG4 LACTIC ACID, ED: Lactic Acid, Venous: 1.84 mmol/L (ref 0.5–2.0)

## 2015-07-18 MED ORDER — IOPAMIDOL (ISOVUE-300) INJECTION 61%
INTRAVENOUS | Status: AC
  Administered 2015-07-18: 100 mL
  Filled 2015-07-18: qty 100

## 2015-07-18 MED ORDER — TETANUS-DIPHTH-ACELL PERTUSSIS 5-2.5-18.5 LF-MCG/0.5 IM SUSP
0.5000 mL | Freq: Once | INTRAMUSCULAR | Status: AC
  Administered 2015-07-18: 0.5 mL via INTRAMUSCULAR

## 2015-07-18 MED ORDER — TETANUS-DIPHTH-ACELL PERTUSSIS 5-2.5-18.5 LF-MCG/0.5 IM SUSP
INTRAMUSCULAR | Status: AC
  Filled 2015-07-18: qty 0.5

## 2015-07-18 MED ORDER — LIDOCAINE HCL 2 % IJ SOLN
10.0000 mL | Freq: Once | INTRAMUSCULAR | Status: AC
  Administered 2015-07-19: 200 mg
  Filled 2015-07-18: qty 20
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
## 2015-07-18 NOTE — ED Notes (Signed)
Patient taken to CT and xray.
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
## 2015-07-19 MED ORDER — IBUPROFEN 800 MG PO TABS
800.0000 mg | ORAL_TABLET | Freq: Once | ORAL | Status: AC
  Administered 2015-07-19: 800 mg via ORAL
  Filled 2015-07-19: qty 1

## 2015-07-19 MED ORDER — SODIUM CHLORIDE 0.9 % IV BOLUS (SEPSIS)
1000.0000 mL | Freq: Once | INTRAVENOUS | Status: AC
  Administered 2015-07-19: 1000 mL via INTRAVENOUS
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
## 2015-07-19 NOTE — ED Notes (Signed)
Patient ambulating to bathroom with stand by assistance.  Patient complaining of left hip pain.
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
## 2016-11-13 ENCOUNTER — Other Ambulatory Visit: Payer: Self-pay | Admitting: Family Medicine

## 2016-11-13 ENCOUNTER — Ambulatory Visit
Admission: RE | Admit: 2016-11-13 | Discharge: 2016-11-13 | Disposition: A | Discharge status: Home / Self Care | Payer: BLUE CROSS/BLUE SHIELD | Source: Ambulatory Visit | Attending: Family Medicine | Admitting: Family Medicine

## 2016-11-13 DIAGNOSIS — M25511 Pain in right shoulder: Secondary | ICD-10-CM

## 2019-12-29 ENCOUNTER — Ambulatory Visit
Admission: RE | Admit: 2019-12-29 | Discharge: 2019-12-29 | Disposition: A | Discharge status: Home / Self Care | Payer: BC Managed Care – PPO | Source: Ambulatory Visit | Attending: Obstetrics and Gynecology | Admitting: Obstetrics and Gynecology

## 2019-12-29 ENCOUNTER — Other Ambulatory Visit: Payer: Self-pay | Admitting: Obstetrics and Gynecology

## 2019-12-29 ENCOUNTER — Ambulatory Visit
Admission: RE | Admit: 2019-12-29 | Discharge: 2019-12-29 | Disposition: A | Discharge status: Home / Self Care | Payer: BLUE CROSS/BLUE SHIELD | Source: Ambulatory Visit | Attending: Obstetrics and Gynecology | Admitting: Obstetrics and Gynecology

## 2019-12-29 DIAGNOSIS — M12561 Traumatic arthropathy, right knee: Secondary | ICD-10-CM

## 2019-12-29 DIAGNOSIS — S8990XA Unspecified injury of unspecified lower leg, initial encounter: Secondary | ICD-10-CM

## 2019-12-29 DIAGNOSIS — S59909A Unspecified injury of unspecified elbow, initial encounter: Secondary | ICD-10-CM

## 2019-12-29 DIAGNOSIS — M546 Pain in thoracic spine: Secondary | ICD-10-CM
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
